# Patient Record
Sex: Male | Born: 1995 | Race: White | Hispanic: No | Marital: Single | Smoking: Never smoker
Health system: Southern US, Community
[De-identification: ages and names within clinical notes are randomized; demographics above are authoritative.]

---
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
## 2017-01-23 IMAGING — CT CT PELVIS W/ CM
2 of 5 series · 16 of 46 positions shown, 19 images · IV contrast (APPLIED)
Comparison: 11/07/2015 ultrasound

CLINICAL DATA: 20-year-old male with pain and swelling in the left
scrotal/ groin region.

EXAM:
CT PELVIS WITH CONTRAST
TECHNIQUE: Multidetector CT imaging of the pelvis was performed using the
standard protocol following the bolus administration of intravenous
contrast.
CONTRAST:  100mL [id number] IOPAMIDOL ([id number]) INJECTION 61%

[Series 2: axial st · axial · 0.90mm/px · z∈[+458,+748]mm · 13 of 66 slices shown, 16 images]
[im 5/66  soft-tissue]
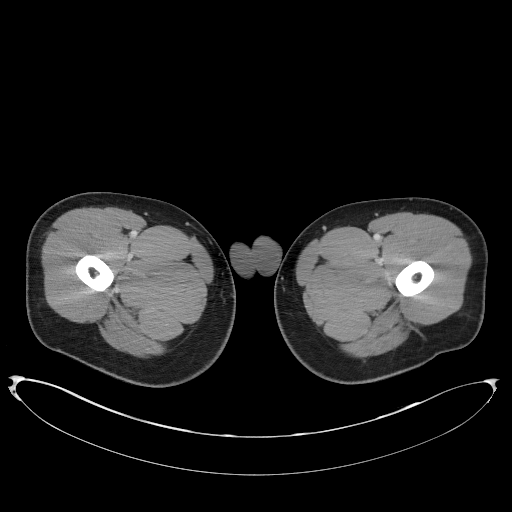
[im 5/66  bone]
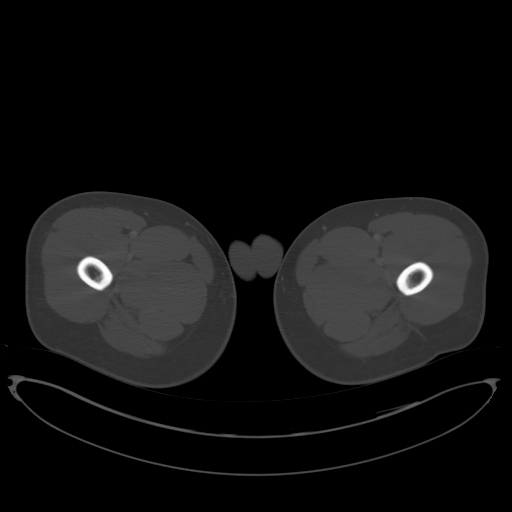
[im 11/66  soft-tissue]
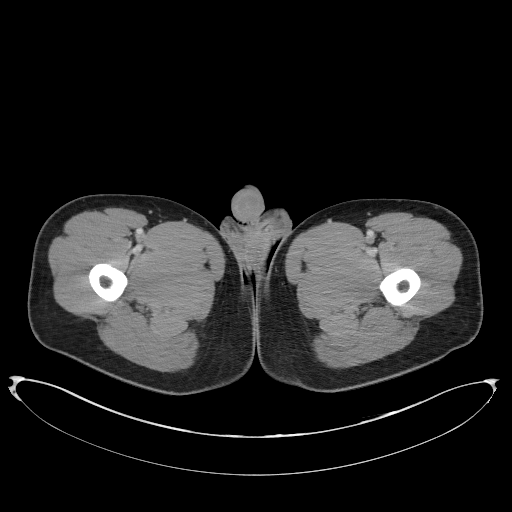
[im 17/66  soft-tissue]
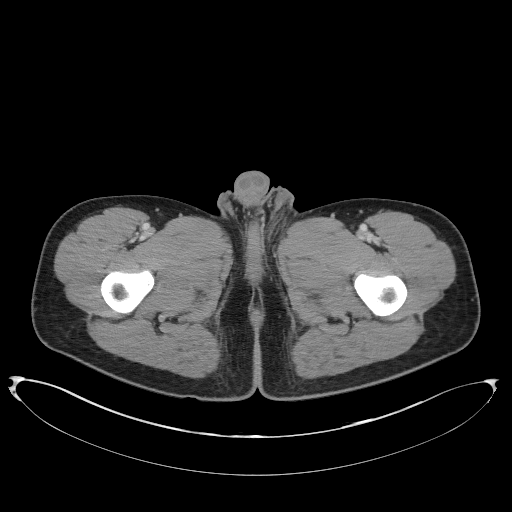
[im 24/66  soft-tissue]
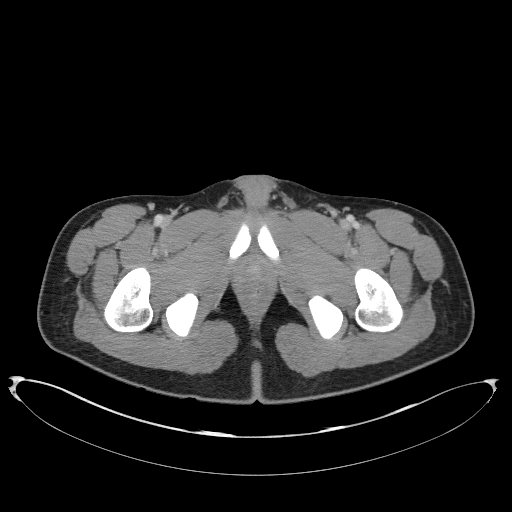
[im 30/66  soft-tissue]
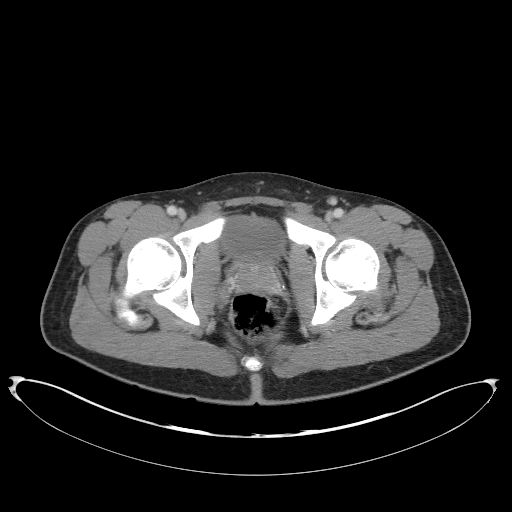
[im 36/66  soft-tissue]
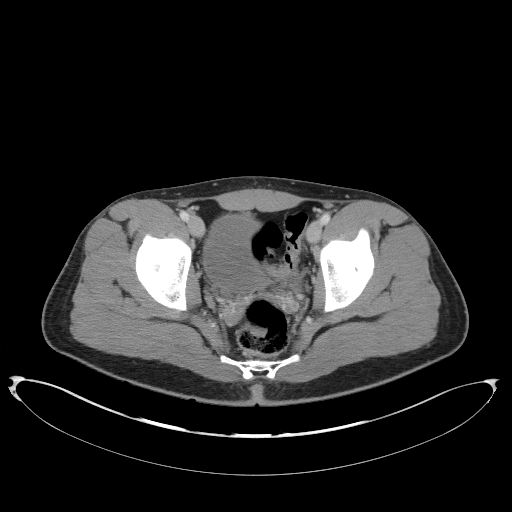
[im 42/66  soft-tissue]
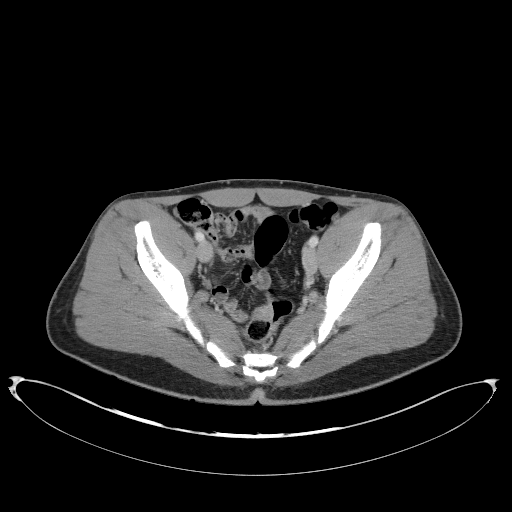
[im 49/66  soft-tissue]
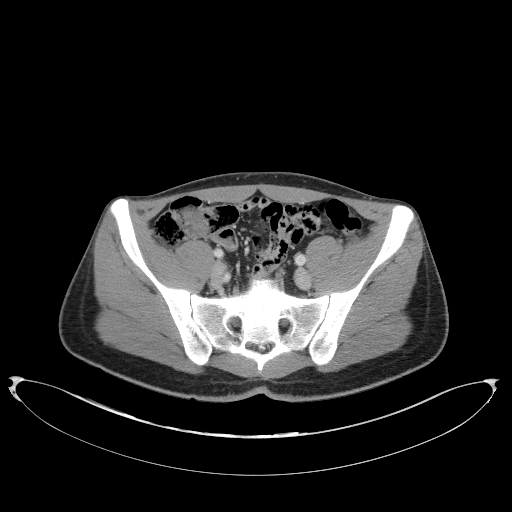
[im 55/66  soft-tissue]
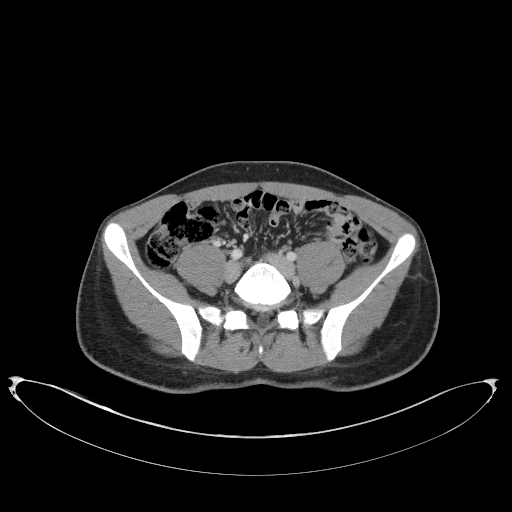
[im 55/66  bone]
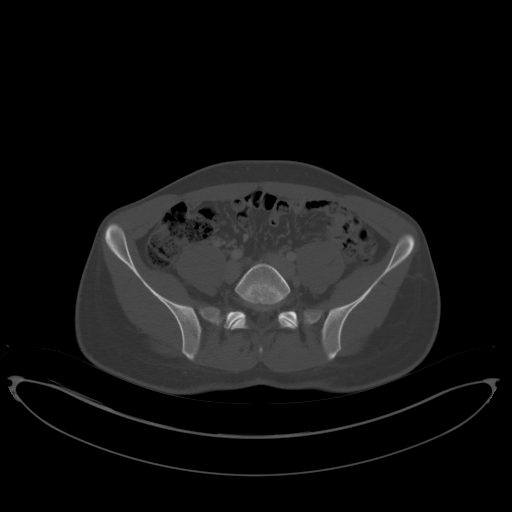
[im 57/66  lung]
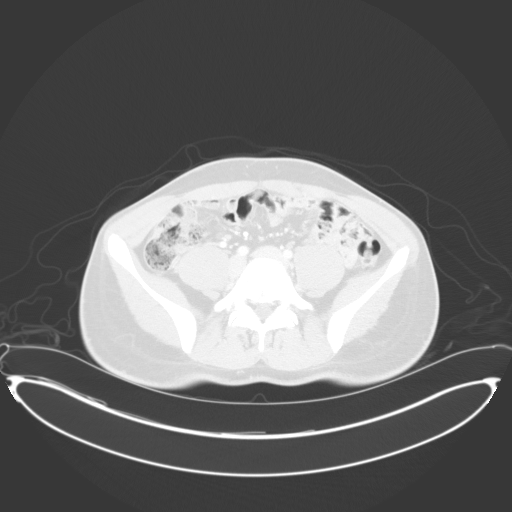
[im 59/66  lung]
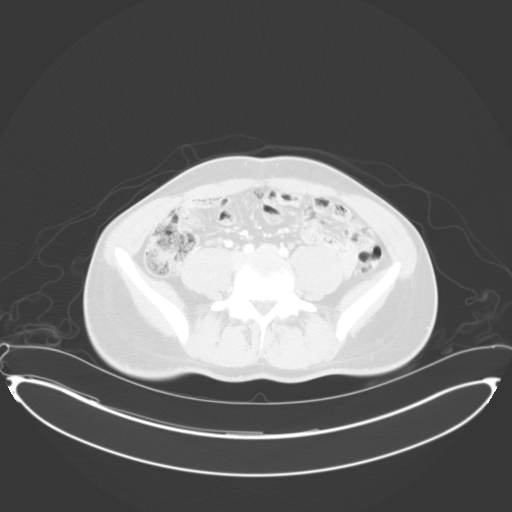
[im 61/66  soft-tissue]
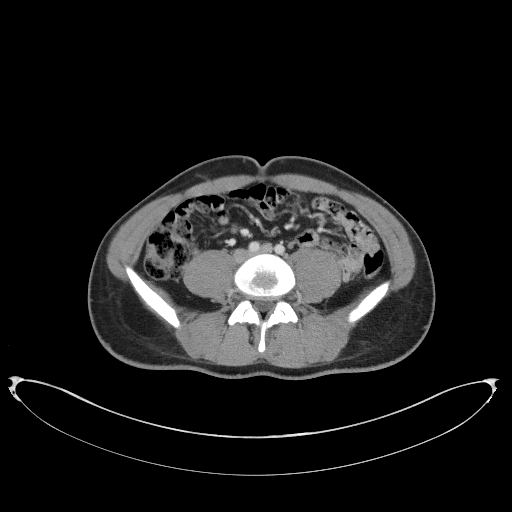
[im 61/66  lung]
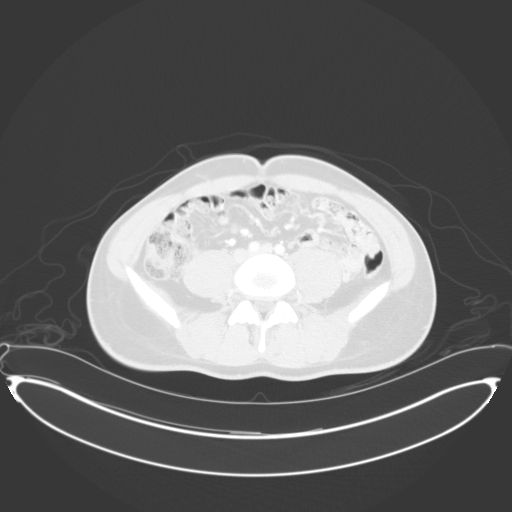
[im 63/66  lung]
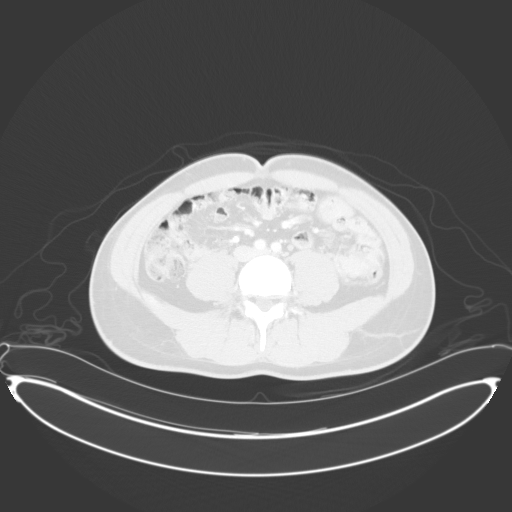

[Series 4: coronal st · coronal · 0.67mm/px · 3 of 71 slices shown]
[im 18/71  soft-tissue]
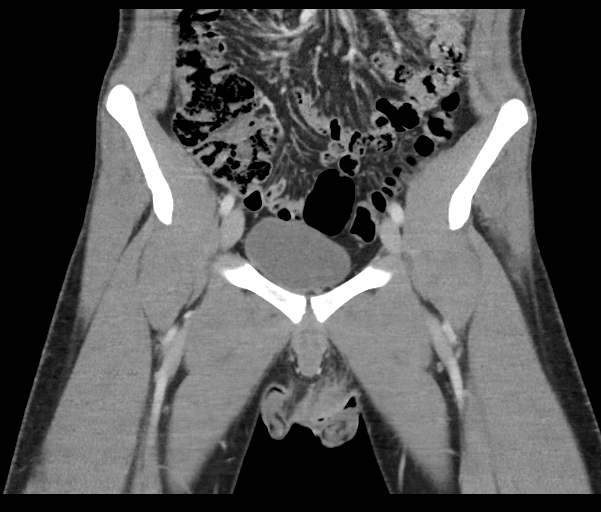
[im 36/71  soft-tissue]
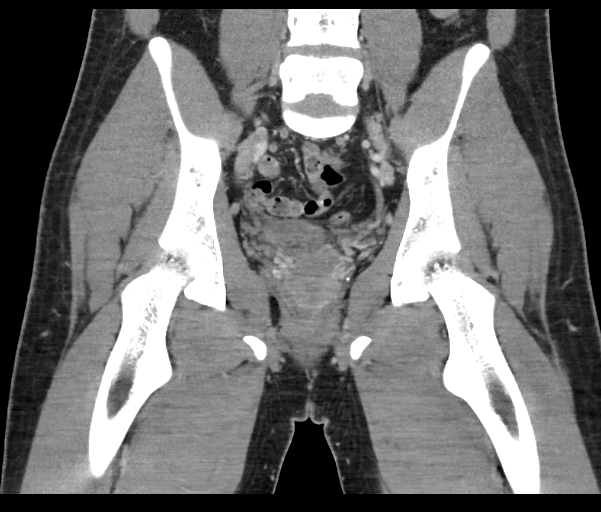
[im 53/71  soft-tissue]
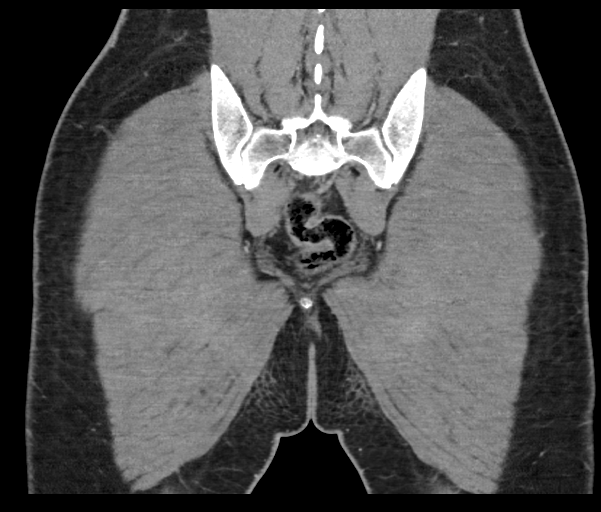

[16 of 46 positions shown; findings below may reference images not displayed]

FINDINGS: A 1.3 x 2 cm rim enhancing collection at the base of the left
scrotum is compatible with an abscess.

The visualized bowel and bladder are unremarkable.

No free fluid within the pelvis identified.

The vascular structures are unremarkable.

No acute or suspicious bony abnormalities.
IMPRESSION: 1.3 x 2 cm abscess at the base of the left scrotum.

## 2019-10-21 ENCOUNTER — Other Ambulatory Visit: Payer: Self-pay

## 2019-10-21 ENCOUNTER — Ambulatory Visit
Admission: EM | Admit: 2019-10-21 | Discharge: 2019-10-21 | Disposition: A | Discharge status: Home / Self Care | Payer: 59 | Attending: Emergency Medicine | Admitting: Emergency Medicine

## 2019-10-21 ENCOUNTER — Encounter: Payer: Self-pay | Admitting: *Deleted

## 2019-10-21 DIAGNOSIS — Z20822 Contact with and (suspected) exposure to covid-19: Secondary | ICD-10-CM

## 2019-10-21 NOTE — ED Triage Notes (Signed)
Reports exposure to COVID on Wednesday last week. Reports mild headache, fatigue and sore throat earlier in the week. No symptoms today.   Has not been vaccinated yet.

## 2019-10-21 NOTE — ED Notes (Signed)
Charting deleted.

## 2019-10-21 NOTE — Discharge Instructions (Signed)
Please activate MyChart. Result will be available in 2-3 days.   Quarantine until you have result.   If result is positive you will need to quarantine for 10 days after symptoms onset.

## 2019-10-24 LAB — NOVEL CORONAVIRUS, NAA: SARS-CoV-2, NAA: DETECTED — AB

## 2019-10-28 ENCOUNTER — Ambulatory Visit
Admission: EM | Admit: 2019-10-28 | Discharge: 2019-10-28 | Disposition: A | Discharge status: Home / Self Care | Payer: 59

## 2019-10-28 ENCOUNTER — Other Ambulatory Visit: Payer: Self-pay

## 2019-10-28 NOTE — ED Notes (Signed)
Spoke with patient, does not need to be seen today. Needs retest for work, PCR will still be positive due to symptom onset of 28th of Aug. Will send work note through [employer].

## 2021-05-02 ENCOUNTER — Other Ambulatory Visit: Payer: Self-pay

## 2021-05-02 ENCOUNTER — Encounter (HOSPITAL_BASED_OUTPATIENT_CLINIC_OR_DEPARTMENT_OTHER): Payer: Self-pay | Admitting: Obstetrics and Gynecology

## 2021-05-02 ENCOUNTER — Emergency Department (HOSPITAL_BASED_OUTPATIENT_CLINIC_OR_DEPARTMENT_OTHER)
Admission: EM | Admit: 2021-05-02 | Discharge: 2021-05-02 | Disposition: A | Discharge status: Home / Self Care | Payer: 59 | Attending: Emergency Medicine | Admitting: Emergency Medicine

## 2021-05-02 DIAGNOSIS — D72829 Elevated white blood cell count, unspecified: Secondary | ICD-10-CM | POA: Diagnosis not present

## 2021-05-02 DIAGNOSIS — R748 Abnormal levels of other serum enzymes: Secondary | ICD-10-CM | POA: Diagnosis not present

## 2021-05-02 DIAGNOSIS — R1013 Epigastric pain: Secondary | ICD-10-CM | POA: Diagnosis not present

## 2021-05-02 DIAGNOSIS — R112 Nausea with vomiting, unspecified: Secondary | ICD-10-CM | POA: Insufficient documentation

## 2021-05-02 LAB — CBC
HCT: 47.5 % (ref 39.0–52.0)
Hemoglobin: 16.2 g/dL (ref 13.0–17.0)
MCH: 28.4 pg (ref 26.0–34.0)
MCHC: 34.1 g/dL (ref 30.0–36.0)
MCV: 83.2 fL (ref 80.0–100.0)
Platelets: 270 10*3/uL (ref 150–400)
RBC: 5.71 MIL/uL (ref 4.22–5.81)
RDW: 13.1 % (ref 11.5–15.5)
WBC: 19.9 10*3/uL — ABNORMAL HIGH (ref 4.0–10.5)
nRBC: 0 % (ref 0.0–0.2)

## 2021-05-02 LAB — RAPID URINE DRUG SCREEN, HOSP PERFORMED
Amphetamines: NOT DETECTED
Barbiturates: NOT DETECTED
Benzodiazepines: NOT DETECTED
Cocaine: NOT DETECTED
Opiates: NOT DETECTED
Tetrahydrocannabinol: POSITIVE — AB

## 2021-05-02 LAB — URINALYSIS, ROUTINE W REFLEX MICROSCOPIC
Bilirubin Urine: NEGATIVE
Glucose, UA: NEGATIVE mg/dL
Hgb urine dipstick: NEGATIVE
Ketones, ur: 15 mg/dL — AB
Nitrite: NEGATIVE
Specific Gravity, Urine: 1.023 (ref 1.005–1.030)
pH: 6.5 (ref 5.0–8.0)

## 2021-05-02 LAB — COMPREHENSIVE METABOLIC PANEL
ALT: 21 U/L (ref 0–44)
AST: 14 U/L — ABNORMAL LOW (ref 15–41)
Albumin: 4.7 g/dL (ref 3.5–5.0)
Alkaline Phosphatase: 57 U/L (ref 38–126)
Anion gap: 9 (ref 5–15)
BUN: 12 mg/dL (ref 6–20)
CO2: 27 mmol/L (ref 22–32)
Calcium: 10 mg/dL (ref 8.9–10.3)
Chloride: 102 mmol/L (ref 98–111)
Creatinine, Ser: 1.08 mg/dL (ref 0.61–1.24)
GFR, Estimated: >60 mL/min (ref >60)
Glucose, Bld: 107 mg/dL — ABNORMAL HIGH (ref 70–99)
Potassium: 3.8 mmol/L (ref 3.5–5.1)
Sodium: 138 mmol/L (ref 135–145)
Total Bilirubin: 0.8 mg/dL (ref 0.3–1.2)
Total Protein: 8 g/dL (ref 6.5–8.1)

## 2021-05-02 LAB — LIPASE, BLOOD: Lipase: 79 U/L — ABNORMAL HIGH (ref 11–51)

## 2021-05-02 MED ORDER — ONDANSETRON 4 MG PO TBDP: 4.0000 mg | ORAL_TABLET | Freq: Three times a day (TID) | ORAL | 0 refills | Status: AC | PRN

## 2021-05-02 MED ORDER — SODIUM CHLORIDE 0.9 % IV BOLUS
1000.0000 mL | Freq: Once | INTRAVENOUS | Status: AC
  Administered 2021-05-02: 1000 mL via INTRAVENOUS

## 2021-05-02 MED ORDER — ONDANSETRON HCL 4 MG/2ML IJ SOLN
4.0000 mg | Freq: Once | INTRAMUSCULAR | Status: AC | PRN
  Administered 2021-05-02: 4 mg via INTRAVENOUS
  Filled 2021-05-02: qty 2

## 2021-05-02 MED ORDER — SODIUM CHLORIDE 0.9 % IV BOLUS
1000.0000 mL | Freq: Once | INTRAVENOUS | Status: AC
Start: 2021-05-02 — End: 2021-05-02
  Administered 2021-05-02: 1000 mL via INTRAVENOUS

## 2021-05-02 MED ORDER — KETOROLAC TROMETHAMINE 15 MG/ML IJ SOLN
15.0000 mg | Freq: Once | INTRAMUSCULAR | Status: AC
  Administered 2021-05-02: 15 mg via INTRAVENOUS
  Filled 2021-05-02: qty 1

## 2021-05-02 MED ORDER — FAMOTIDINE IN NACL 20-0.9 MG/50ML-% IV SOLN
20.0000 mg | Freq: Once | INTRAVENOUS | Status: AC
Start: 2021-05-02 — End: 2021-05-02
  Administered 2021-05-02: 20 mg via INTRAVENOUS
  Filled 2021-05-02: qty 50

## 2021-05-02 NOTE — ED Notes (Signed)
Pt drank some Gatorade and also ate a few bites of Chicken. Pt stated he is feeling a lot better.  ?

## 2021-05-02 NOTE — ED Notes (Signed)
Lab contacted to perform the urine culture and the Chlamydia  ?

## 2021-05-02 NOTE — ED Notes (Signed)
Patient drinking fluids at bedside ?

## 2021-05-02 NOTE — Discharge Instructions (Signed)
Avoid fried foods, fatty foods, greasy foods, and milk products until symptoms resolve. Drink plenty of clear liquids. We recommend the use of Zofran as prescribed for nausea/vomiting. Follow-up with your primary care doctor to ensure resolution of symptoms. 

## 2021-05-02 NOTE — ED Provider Notes (Signed)
[facility] Provider Note   CSN: [id number] Arrival date & time: 05/02/21  1222     History  Chief Complaint  Patient presents with   Nausea   Emesis    Terry Marks is a 26 y.o. male.  26 year old male with no significant past medical history presents to the emergency department for evaluation of nausea and vomiting.  He reports onset of symptoms yesterday at 1600.  Has had too numerous to count episodes of vomiting since symptom onset.  He has tried drinking Pedialyte and water, but is unable to keep anything down.  Reports that his emesis has been primarily stomach acid as well as bile.  He has more recently noticed small streaks of bright red blood in it.  Complaining of associated chills as well as sweats and lightheadedness.  Has only voided 1 time today.  Last bowel movement was 2 days ago.  He has no history of abdominal surgeries and denies abdominal pain, hematemesis, melena, hematochezia, alcohol use, marijuana use, fever.  No reported sick contacts.  The history is provided by the patient. No language interpreter was used.  Emesis     Home Medications Prior to Admission medications   Medication Sig Start Date End Date Taking? Authorizing Provider  ondansetron (ZOFRAN-ODT) 4 MG disintegrating tablet Take 1 tablet (4 mg total) by mouth every 8 (eight) hours as needed for nausea or vomiting. 05/02/21  Yes Antony MaduraHumes, Churchill Grimsley, PA-C  hydrocortisone cream 1 % Apply to rash on face TID 07/16/12   Reuben LikesKeller, David C, MD  ibuprofen (ADVIL,MOTRIN) 200 MG tablet Take 200 mg by mouth every 6 (six) hours as needed for headache.    [provider]  ondansetron (ZOFRAN) 4 MG tablet Take 1 tablet (4 mg total) by mouth every 6 (six) hours. 12/23/13   Donnetta Hutchingook, Brian, MD  predniSONE (DELTASONE) 10 MG tablet Take 4 tabs daily for 4 days, 3 tabs daily for 4 days, 2 tabs daily for 4 days, then 1 tab daily for 4 days. 07/16/12   Reuben LikesKeller, David C, MD  triamcinolone cream  (KENALOG) 0.1 % Apply to rash on body TID. 07/16/12   Reuben LikesKeller, David C, MD      Allergies    Patient has no known allergies.    Review of Systems   Review of Systems  Gastrointestinal:  Positive for vomiting.  Ten systems reviewed and are negative for acute change, except as noted in the HPI.    Physical Exam Updated Vital Signs BP 95/71 (BP Location: Left Arm)    Pulse (!) 108    Temp 98.6 F (37 C)    Resp 14    Ht 6' (1.829 m)    Wt 91.2 kg    SpO2 99%    BMI 27.26 kg/m   Physical Exam Vitals and nursing note reviewed.  Constitutional:      General: He is not in acute distress.    Appearance: He is well-developed. He is not diaphoretic.     Comments: Nontoxic-appearing and in no distress  HENT:     Head: Normocephalic and atraumatic.  Eyes:     General: No scleral icterus.    Conjunctiva/sclera: Conjunctivae normal.  Cardiovascular:     Rate and Rhythm: Normal rate and regular rhythm.     Pulses: Normal pulses.  Pulmonary:     Effort: Pulmonary effort is normal. No respiratory distress.     Breath sounds: No stridor. No wheezing.     Comments: Respirations  even and unlabored Abdominal:     Comments: Abdomen soft, nondistended.  There is minimal epigastric tenderness on deep palpation.  Otherwise benign exam.  Musculoskeletal:        General: Normal range of motion.     Cervical back: Normal range of motion.  Skin:    General: Skin is warm and dry.     Coloration: Skin is not pale.     Findings: No erythema or rash.  Neurological:     Mental Status: He is alert and oriented to person, place, and time.     Coordination: Coordination normal.  Psychiatric:        Behavior: Behavior normal.    ED Results / Procedures / Treatments   Labs (all labs ordered are listed, but only abnormal results are displayed) Labs Reviewed  LIPASE, BLOOD - Abnormal; Notable for the following components:      Result Value   Lipase 79 (*)    All other components within normal limits   COMPREHENSIVE METABOLIC PANEL - Abnormal; Notable for the following components:   Glucose, Bld 107 (*)    AST 14 (*)    All other components within normal limits  CBC - Abnormal; Notable for the following components:   WBC 19.9 (*)    All other components within normal limits  URINALYSIS, ROUTINE W REFLEX MICROSCOPIC - Abnormal; Notable for the following components:   Ketones, ur 15 (*)    Protein, ur TRACE (*)    Leukocytes,Ua MODERATE (*)    Bacteria, UA FEW (*)    All other components within normal limits  RAPID URINE DRUG SCREEN, HOSP PERFORMED - Abnormal; Notable for the following components:   Tetrahydrocannabinol POSITIVE (*)    All other components within normal limits  URINE CULTURE  GC/CHLAMYDIA PROBE AMP ([place]) NOT AT [facility]    EKG None  Radiology No results found.  Procedures Procedures    Medications Ordered in ED Medications  ondansetron (ZOFRAN) injection 4 mg (4 mg Intravenous Given 05/02/21 1243)  sodium chloride 0.9 % bolus 1,000 mL (1,000 mLs Intravenous New Bag/Given 05/02/21 1243)  famotidine (PEPCID) IVPB 20 mg premix (0 mg Intravenous Stopped 05/02/21 1508)  sodium chloride 0.9 % bolus 1,000 mL (1,000 mLs Intravenous New Bag/Given 05/02/21 1358)  ketorolac (TORADOL) 15 MG/ML injection 15 mg (15 mg Intravenous Given 05/02/21 1416)    ED Course/ Medical Decision Making/ A&P Clinical Course as of 05/02/21 1634  Sun May 02, 2021  1325 Minimal lipase elevation, likely from stress of persistent vomiting.  Not presently at a level to raise concern for acute pancreatitis.  Leukocytosis of 19.9 also felt secondary to dehydration, ongoing N/V rather than infectious process.  No significant electrolyte derangements.  Liver and kidney function preserved. [KH]  1420 Symptoms feeling improved, per patient. Will give second liter IVF and PO trial. [KH]  1533 Patient tolerating PO fluids [KH]  1624 Patient states that he is feeling much better.  He has been able  to tolerate Zaxby's. [KH]    Clinical Course User Index [KH] Antony Madura, PA-C                           Medical Decision Making Amount and/or Complexity of Data Reviewed Labs: ordered.  Risk Prescription drug management.   This patient presents to the ED for concern of N/V, this involves an extensive number of treatment options, and is a complaint that carries with it a  high risk of complications and morbidity.  The differential diagnosis includes viral illness vs food borne illness vs gastritis vs cholelithiasis vs pancreatitis vs pSBO vs SBO   Co morbidities that complicate the patient evaluation  None    Additional history obtained:  Additional history obtained from girlfriend at bedside   Lab Tests:  I Ordered, and personally interpreted labs.  The pertinent results include:  leukocytosis of 19.9, mild lipase elevation to 79; pyuria noted, but patient asymptomatic. Pending GC/Chlamydia, urine culture.    Cardiac Monitoring:  The patient was maintained on a cardiac monitor.  I personally viewed and interpreted the cardiac monitored which showed an underlying rhythm of: mild sinus tachycardia vs NSR; most recently 93bpm on monitor @1615    Medicines ordered and prescription drug management:  I ordered medication including IVF for dehydration and IV antiemetics, IV Pepcid for N/V  Reevaluation of the patient after these medicines showed that the patient resolved I have reviewed the patients home medicines and have made adjustments as needed   Test Considered:  Acute abdominal series   Reevaluation:  After the interventions noted above, I reevaluated the patient and found that they have :resolved   Social Determinants of Health:  Insured   Dispostion:  After consideration of the diagnostic results and the patients response to treatment, I feel that the patent would benefit from discharge on course of antiemetics for continued symptom control. Suspect  viral vs food borne illness. Initial and repeat abdominal exams benign; patient denies pain. Encouraged PCP follow up, adequate PO hydration. Return precautions discussed and provided. Patient discharged in stable condition with no unaddressed concerns.          Final Clinical Impression(s) / ED Diagnoses Final diagnoses:  Nausea and vomiting, unspecified vomiting type    Rx / DC Orders ED Discharge Orders          Ordered    ondansetron (ZOFRAN-ODT) 4 MG disintegrating tablet  Every 8 hours PRN        05/02/21 1625              07/02/21, PA-C 05/02/21 1638    [place], Adam, DO 05/03/21 0900

## 2021-05-02 NOTE — ED Triage Notes (Signed)
Patient reports to the ER for nausea and emesis. Patient reports yesterday he started being unable to keep anything down and it has progressed to bile with a little bit of blood in it. Patient also endorses chills.  ?

## 2021-05-03 LAB — GC/CHLAMYDIA PROBE AMP (~~LOC~~) NOT AT ARMC
Chlamydia: NEGATIVE
Comment: NEGATIVE
Comment: NORMAL
Neisseria Gonorrhea: NEGATIVE

## 2021-05-04 LAB — URINE CULTURE: Culture: NO GROWTH

## 2021-06-04 ENCOUNTER — Other Ambulatory Visit: Payer: Self-pay

## 2021-06-04 ENCOUNTER — Encounter (HOSPITAL_COMMUNITY): Payer: Self-pay | Admitting: Emergency Medicine

## 2021-06-04 ENCOUNTER — Emergency Department (HOSPITAL_COMMUNITY)
Admission: EM | Admit: 2021-06-04 | Discharge: 2021-06-05 | Disposition: A | Discharge status: Home / Self Care | Payer: 59 | Attending: Emergency Medicine | Admitting: Emergency Medicine

## 2021-06-04 DIAGNOSIS — B2700 Gammaherpesviral mononucleosis without complication: Secondary | ICD-10-CM | POA: Insufficient documentation

## 2021-06-04 DIAGNOSIS — J029 Acute pharyngitis, unspecified: Secondary | ICD-10-CM | POA: Diagnosis present

## 2021-06-04 NOTE — ED Triage Notes (Signed)
Patient states that he was seen at the ED in [facility] and was told he has mono.   He was tested for strep and it was negative.  Patient continues with sore throat, has white patches in his throat and having pain with swallowing.   ?

## 2021-06-04 NOTE — ED Provider Triage Note (Signed)
Emergency Medicine Provider Triage Evaluation Note ? ?Georgann Housekeeper , a 26 y.o. male  was evaluated in triage.  Pt complains of sore throat.  Patient was seen on 4/9 at [facility] emergency department for sore throat.  He was tested for strep and mono.  He was called and they told him that he has mono.  He continues to have sore throat and white patches in his throat.  He is having some pain with swallowing.  He is tolerating his own secretions. ? ?Review of Systems  ?Positive: As above ?Negative: Fever, inability to swallow ? ?Physical Exam  ?BP 137/76 (BP Location: Right Arm)   Pulse 76   Temp 97.9 ?F (36.6 ?C)   Resp 18   SpO2 100%  ?Gen:   Awake, no distress   ?Resp:  Normal effort  ?MSK:   Moves extremities without difficulty  ?Other:  Tolerating own secretions ? ?Medical Decision Making  ?Medically screening exam initiated at 9:47 PM.  Appropriate orders placed.  JERAMIH BATTANI was informed that the remainder of the evaluation will be completed by another provider, this initial triage assessment does not replace that evaluation, and the importance of remaining in the ED until their evaluation is complete. ? ? ?  ?Houa Nie T, PA-C ?06/04/21 2147 ? ?

## 2021-06-05 MED ORDER — KETOROLAC TROMETHAMINE 60 MG/2ML IM SOLN
30.0000 mg | Freq: Once | INTRAMUSCULAR | Status: AC
  Administered 2021-06-05: 30 mg via INTRAMUSCULAR
  Filled 2021-06-05: qty 2

## 2021-06-05 MED ORDER — LIDOCAINE VISCOUS HCL 2 % MT SOLN
15.0000 mL | Freq: Once | OROMUCOSAL | Status: AC
  Administered 2021-06-05: 15 mL via OROMUCOSAL
  Filled 2021-06-05: qty 15

## 2021-06-05 MED ORDER — LIDOCAINE VISCOUS HCL 2 % MT SOLN: 10.0000 mL | Freq: Four times a day (QID) | OROMUCOSAL | 0 refills | Status: AC | PRN

## 2021-06-05 MED ORDER — DEXAMETHASONE SODIUM PHOSPHATE 10 MG/ML IJ SOLN
10.0000 mg | Freq: Once | INTRAMUSCULAR | Status: AC
  Administered 2021-06-05: 10 mg via INTRAMUSCULAR
  Filled 2021-06-05: qty 1

## 2021-06-05 NOTE — ED Provider Notes (Signed)
?[facility] EMERGENCY DEPARTMENT ?Provider Note ? ?CSN: [id number] ?Arrival date & time: 06/04/21 1948 ? ?Chief Complaint(s) ?Sore Throat ? ?HPI ?Terry Marks is a 26 y.o. male   ? ? ?Sore Throat ?This is a new problem. Episode onset: 1 week. The problem occurs constantly. The problem has not changed since onset.Pertinent negatives include no chest pain, no headaches and no shortness of breath. The symptoms are aggravated by swallowing. Relieved by: toradol and decadron given to him at [facility] ER 5 days ago.  ? ?Patient was diagnosed with mono. ? ?Past Medical History ?History reviewed. No pertinent past medical history. ?There are no problems to display for this patient. ? ?Home Medication(s) ?Prior to Admission medications   ?Medication Sig Start Date End Date Taking? Authorizing Provider  ?DULoxetine (CYMBALTA) 20 MG capsule Take 20 mg by mouth at bedtime. 05/26/21  Yes [provider]  ?ibuprofen (ADVIL,MOTRIN) 200 MG tablet Take 600 mg by mouth every 6 (six) hours as needed for headache or moderate pain.   Yes [provider]  ?lidocaine (XYLOCAINE) 2 % solution Use as directed 10 mLs in the mouth or throat every 6 (six) hours as needed (sore throat). 06/05/21  Yes Corion Sherrod, Amadeo Garnet, MD  ?hydrocortisone cream 1 % Apply to rash on face TID ?Patient not taking: Reported on 06/05/2021 07/16/12   Reuben Likes, MD  ?ondansetron (ZOFRAN) 4 MG tablet Take 1 tablet (4 mg total) by mouth every 6 (six) hours. ?Patient not taking: Reported on 06/05/2021 12/23/13   Donnetta Hutching, MD  ?ondansetron (ZOFRAN-ODT) 4 MG disintegrating tablet Take 1 tablet (4 mg total) by mouth every 8 (eight) hours as needed for nausea or vomiting. ?Patient not taking: Reported on 06/05/2021 05/02/21   Antony Madura, PA-C  ?predniSONE (DELTASONE) 10 MG tablet Take 4 tabs daily for 4 days, 3 tabs daily for 4 days, 2 tabs daily for 4 days, then 1 tab daily for 4 days. ?Patient not taking: Reported on 06/05/2021  07/16/12   Reuben Likes, MD  ?triamcinolone cream (KENALOG) 0.1 % Apply to rash on body TID. ?Patient not taking: Reported on 06/05/2021 07/16/12   Reuben Likes, MD  ?                                                                                                                                  ?Allergies ?Patient has no known allergies. ? ?Review of Systems ?Review of Systems  ?Respiratory:  Negative for shortness of breath.   ?Cardiovascular:  Negative for chest pain.  ?Neurological:  Negative for headaches.  ?As noted in HPI ? ?Physical Exam ?Vital Signs  ?I have reviewed the triage vital signs ?BP 111/71 (BP Location: Right Arm)   Pulse 97   Temp 97.9 ?F (36.6 ?C)   Resp 16   SpO2 98%  ? ?Physical Exam ?Vitals reviewed.  ?Constitutional:   ?   General: He is  not in acute distress. ?   Appearance: He is well-developed. He is not diaphoretic.  ?HENT:  ?   Head: Normocephalic and atraumatic.  ?   Right Ear: External ear normal.  ?   Left Ear: External ear normal.  ?   Nose: Nose normal.  ?   Mouth/Throat:  ?   Mouth: Mucous membranes are moist.  ?   Tonsils: Tonsillar exudate present. 2+ on the right. 2+ on the left.  ?Eyes:  ?   General: No scleral icterus. ?   Conjunctiva/sclera: Conjunctivae normal.  ?Neck:  ?   Trachea: Phonation normal.  ?Cardiovascular:  ?   Rate and Rhythm: Normal rate and regular rhythm.  ?Pulmonary:  ?   Effort: Pulmonary effort is normal. No respiratory distress.  ?   Breath sounds: No stridor.  ?Abdominal:  ?   General: There is no distension.  ?Musculoskeletal:     ?   General: Normal range of motion.  ?   Cervical back: Normal range of motion.  ?Neurological:  ?   Mental Status: He is alert and oriented to person, place, and time.  ?Psychiatric:     ?   Behavior: Behavior normal.  ? ? ?ED Results and Treatments ?Labs ?(all labs ordered are listed, but only abnormal results are displayed) ?Labs Reviewed - No data to display                                                                                                                        ?EKG ? EKG Interpretation ? ?Date/Time:    ?Ventricular Rate:    ?PR Interval:    ?QRS Duration:   ?QT Interval:    ?QTC Calculation:   ?R Axis:     ?Text Interpretation:   ?  ? ?  ? ?Radiology ?No results found. ? ?Pertinent labs & imaging results that were available during my care of the patient were reviewed by me and considered in my medical decision making (see MDM for details). ? ?Medications Ordered in ED ?Medications  ?dexamethasone (DECADRON) injection 10 mg (10 mg Intramuscular Given 06/05/21 0530)  ?ketorolac (TORADOL) injection 30 mg (30 mg Intramuscular Given 06/05/21 0530)  ?lidocaine (XYLOCAINE) 2 % viscous mouth solution 15 mL (15 mLs Mouth/Throat Given 06/05/21 0530)  ?                                                               ?                                                                    ?  Procedures ?Procedures ? ?(including critical care time) ? ?Medical Decision Making / ED Course ? ? ? Complexity of Problem: ? ?Additional history obtained: ?On record review confirmed positive Monospot and negative strep from outside hospital ? ?Patient's presenting problem/concern, DDX, and MDM listed below: ?Sore throat ?Known infectious mono ?No respiratory distress ? ?Hospitalization Considered:  ?No ? ?Initial Intervention:  ?IM Decadron, IM Toradol, viscous lidocaine ? ?  Complexity of Data: ?  ?Laboratory Tests ordered listed below with my independent interpretation: ?Not necessary ?  ?Imaging Studies ordered listed below with my independent interpretation: ?Not necessary ?  ?  ?ED Course:   ? ?Assessment, Add'l Intervention, and Reassessment: ?Infectious mono ?We will prescribe viscous lidocaine for symptomatic relief ?Recommended liquid Tylenol/Motrin for discomfort ?Encourage hydration  ? ? ? ?Final Clinical Impression(s) / ED Diagnoses ?Final diagnoses:  ?Gammaherpesviral mononucleosis without complication  ? ?The patient appears  reasonably screened and/or stabilized for discharge and I doubt any other medical condition or other [facility] requiring further screening, evaluation, or treatment in the ED at this time prior to discharge. Safe for discharge with strict return precautions. ? ?Disposition: Discharge ? ?Condition: Good ? ?I have discussed the results, Dx and Tx plan with the patient/family who expressed understanding and agree(s) with the plan. Discharge instructions discussed at length. The patient/family was given strict return precautions who verbalized understanding of the instructions. No further questions at time of discharge.  ? ? ?ED Discharge Orders   ? ?      Ordered  ?  lidocaine (XYLOCAINE) 2 % solution  Every 6 hours PRN       ? 06/05/21 0553  ? ?  ?  ? ?  ? ? ?Follow Up: ?[facility] ?[address] ?[address] ?[place] [id number] ?[phone] ? ?Call  ?as needed ? ? ? ?  ? ? ? ? ? ?This chart was dictated using voice recognition software.  Despite best efforts to proofread,  errors can occur which can change the documentation meaning. ? ?  ?Nira Connardama, Elden Brucato Eduardo, MD ?06/05/21 [phone] ? ?

## 2021-06-05 NOTE — ED Notes (Signed)
Pt informed staff he was going to the bathroom. Pt has not returned. ?

## 2021-06-05 NOTE — ED Notes (Signed)
Pt left prior to receiving discharge paperwork
